# Patient Record
Sex: Female | Born: 1975 | Race: White | Hispanic: No | Smoking: Never smoker
Health system: Southern US, Community
[De-identification: ages and names within clinical notes are randomized; demographics above are authoritative.]

## PROBLEM LIST (undated history)

## (undated) DIAGNOSIS — N838 Other noninflammatory disorders of ovary, fallopian tube and broad ligament: Secondary | ICD-10-CM

## (undated) HISTORY — DX: Other noninflammatory disorders of ovary, fallopian tube and broad ligament: N83.8

## (undated) HISTORY — PX: APPENDECTOMY: SHX54

## (undated) HISTORY — PX: GASTRIC BYPASS: SHX52

## (undated) HISTORY — PX: OVARIAN CYST REMOVAL: SHX89

---

## 2006-02-03 ENCOUNTER — Emergency Department: Payer: Self-pay | Admitting: Emergency Medicine

## 2006-02-13 ENCOUNTER — Ambulatory Visit: Payer: Self-pay | Admitting: Surgery

## 2006-03-13 ENCOUNTER — Ambulatory Visit: Payer: Self-pay | Admitting: Surgery

## 2006-04-26 ENCOUNTER — Ambulatory Visit: Payer: Self-pay | Admitting: Obstetrics and Gynecology

## 2006-04-28 ENCOUNTER — Inpatient Hospital Stay: Payer: Self-pay | Admitting: Obstetrics and Gynecology

## 2006-05-18 ENCOUNTER — Inpatient Hospital Stay: Payer: Self-pay | Admitting: Obstetrics and Gynecology

## 2014-08-14 ENCOUNTER — Other Ambulatory Visit: Payer: Self-pay | Admitting: Bariatrics

## 2014-08-29 ENCOUNTER — Ambulatory Visit
Admission: RE | Admit: 2014-08-29 | Discharge: 2014-08-29 | Disposition: A | Discharge status: Home / Self Care | Payer: BLUE CROSS/BLUE SHIELD | Source: Ambulatory Visit | Attending: Bariatrics | Admitting: Bariatrics

## 2014-08-29 ENCOUNTER — Ambulatory Visit: Admission: RE | Admit: 2014-08-29 | Payer: Self-pay | Source: Ambulatory Visit

## 2014-08-29 ENCOUNTER — Other Ambulatory Visit
Admission: RE | Admit: 2014-08-29 | Discharge: 2014-08-29 | Disposition: A | Discharge status: Home / Self Care | Payer: BLUE CROSS/BLUE SHIELD | Source: Ambulatory Visit | Attending: Bariatrics | Admitting: Bariatrics

## 2014-08-29 ENCOUNTER — Other Ambulatory Visit: Payer: Self-pay | Admitting: Bariatrics

## 2014-08-29 DIAGNOSIS — Z01818 Encounter for other preprocedural examination: Secondary | ICD-10-CM | POA: Insufficient documentation

## 2014-08-29 DIAGNOSIS — Z01810 Encounter for preprocedural cardiovascular examination: Secondary | ICD-10-CM | POA: Diagnosis present

## 2014-08-29 DIAGNOSIS — Z01812 Encounter for preprocedural laboratory examination: Secondary | ICD-10-CM | POA: Diagnosis present

## 2014-08-29 DIAGNOSIS — K219 Gastro-esophageal reflux disease without esophagitis: Secondary | ICD-10-CM | POA: Insufficient documentation

## 2014-08-29 LAB — COMPREHENSIVE METABOLIC PANEL
ALT: 38 U/L (ref 14–54)
AST: 31 U/L (ref 15–41)
Albumin: 3.5 g/dL (ref 3.5–5.0)
Alkaline Phosphatase: 66 U/L (ref 38–126)
Anion gap: 9 (ref 5–15)
BUN: 18 mg/dL (ref 6–20)
CALCIUM: 9 mg/dL (ref 8.9–10.3)
CHLORIDE: 101 mmol/L (ref 101–111)
CO2: 28 mmol/L (ref 22–32)
CREATININE: 0.65 mg/dL (ref 0.44–1.00)
GLUCOSE: 116 mg/dL — AB (ref 65–99)
Potassium: 3.8 mmol/L (ref 3.5–5.1)
Sodium: 138 mmol/L (ref 135–145)
Total Bilirubin: 0.5 mg/dL (ref 0.3–1.2)
Total Protein: 7.7 g/dL (ref 6.5–8.1)

## 2014-08-29 LAB — CBC WITH DIFFERENTIAL/PLATELET
BASOS ABS: 0 10*3/uL (ref 0–0.1)
Basophils Relative: 0 %
Eosinophils Absolute: 0.2 10*3/uL (ref 0–0.7)
Eosinophils Relative: 2 %
HCT: 36 % (ref 35.0–47.0)
HEMOGLOBIN: 11.8 g/dL — AB (ref 12.0–16.0)
LYMPHS PCT: 28 %
Lymphs Abs: 2.9 10*3/uL (ref 1.0–3.6)
MCH: 25.8 pg — ABNORMAL LOW (ref 26.0–34.0)
MCHC: 32.8 g/dL (ref 32.0–36.0)
MCV: 78.8 fL — AB (ref 80.0–100.0)
Monocytes Absolute: 0.4 10*3/uL (ref 0.2–0.9)
Monocytes Relative: 4 %
NEUTROS ABS: 7 10*3/uL — AB (ref 1.4–6.5)
NEUTROS PCT: 66 %
PLATELETS: 285 10*3/uL (ref 150–440)
RBC: 4.57 MIL/uL (ref 3.80–5.20)
RDW: 13.7 % (ref 11.5–14.5)
WBC: 10.6 10*3/uL (ref 3.6–11.0)

## 2014-08-29 LAB — APTT: aPTT: 26 seconds (ref 24–36)

## 2014-08-29 LAB — PROTIME-INR
INR: 0.94
PROTHROMBIN TIME: 12.8 s (ref 11.4–15.0)

## 2014-08-29 LAB — IRON: Iron: 39 ug/dL (ref 28–170)

## 2014-08-29 LAB — PREALBUMIN: Prealbumin: 27.4 mg/dL (ref 18–38)

## 2014-08-29 LAB — FERRITIN: Ferritin: 137 ng/mL (ref 11–307)

## 2014-08-29 LAB — HEMOGLOBIN A1C: Hgb A1c MFr Bld: 5.8 % (ref 4.0–6.0)

## 2014-08-29 LAB — FOLATE: Folate: 13.9 ng/mL (ref >5.9)

## 2014-08-29 LAB — VITAMIN B12: Vitamin B-12: 332 pg/mL (ref 180–914)

## 2014-08-29 LAB — TSH: TSH: 3.221 u[IU]/mL (ref 0.350–4.500)

## 2014-08-29 LAB — MAGNESIUM: MAGNESIUM: 2.1 mg/dL (ref 1.7–2.4)

## 2014-08-30 LAB — VITAMIN D 25 HYDROXY (VIT D DEFICIENCY, FRACTURES): Vit D, 25-Hydroxy: 31.9 ng/mL (ref 30.0–100.0)

## 2014-09-01 LAB — PTH, INTACT AND CALCIUM
Calcium, Total (PTH): 9.2 mg/dL (ref 8.7–10.2)
PTH: 16 pg/mL (ref 15–65)

## 2014-09-01 LAB — H PYLORI, IGM, IGG, IGA AB
H Pylori IgG: 1.1 U/mL — ABNORMAL HIGH (ref 0.0–0.8)
H. Pylogi, Iga Abs: <9 units (ref 0.0–8.9)

## 2014-09-01 LAB — VITAMIN B1: Vitamin B1 (Thiamine): 136.1 nmol/L (ref 66.5–200.0)

## 2014-09-01 LAB — COPPER, SERUM: Copper: 182 ug/dL — ABNORMAL HIGH (ref 72–166)

## 2014-09-01 LAB — MISC LABCORP TEST (SEND OUT): LabCorp test name: 180836

## 2014-09-01 LAB — ZINC: Zinc: 82 ug/dL (ref 56–134)

## 2014-09-01 LAB — VITAMIN E: Alpha-Tocopherol: 14.5 mg/L (ref 5.3–16.8)

## 2014-09-01 LAB — VITAMIN A: Vitamin A (Retinoic Acid): 64 ug/dL (ref 20–65)

## 2014-09-05 LAB — MISC LABCORP TEST (SEND OUT): LABCORP TEST NAME: 121200

## 2014-09-11 ENCOUNTER — Ambulatory Visit: Payer: BLUE CROSS/BLUE SHIELD | Attending: Neurology

## 2014-09-11 DIAGNOSIS — G4733 Obstructive sleep apnea (adult) (pediatric): Secondary | ICD-10-CM | POA: Diagnosis present

## 2014-09-11 DIAGNOSIS — G4761 Periodic limb movement disorder: Secondary | ICD-10-CM | POA: Diagnosis not present

## 2014-11-03 DIAGNOSIS — G4733 Obstructive sleep apnea (adult) (pediatric): Secondary | ICD-10-CM | POA: Insufficient documentation

## 2015-07-09 ENCOUNTER — Other Ambulatory Visit: Payer: Self-pay | Admitting: Bariatrics

## 2015-07-09 ENCOUNTER — Ambulatory Visit
Admission: RE | Admit: 2015-07-09 | Discharge: 2015-07-09 | Disposition: A | Discharge status: Home / Self Care | Payer: BLUE CROSS/BLUE SHIELD | Source: Ambulatory Visit | Attending: Bariatrics | Admitting: Bariatrics

## 2015-07-09 DIAGNOSIS — Z9884 Bariatric surgery status: Secondary | ICD-10-CM | POA: Insufficient documentation

## 2015-07-09 DIAGNOSIS — R112 Nausea with vomiting, unspecified: Secondary | ICD-10-CM

## 2016-08-09 DIAGNOSIS — Z9884 Bariatric surgery status: Secondary | ICD-10-CM | POA: Insufficient documentation

## 2016-08-09 DIAGNOSIS — Z6833 Body mass index (BMI) 33.0-33.9, adult: Secondary | ICD-10-CM | POA: Insufficient documentation

## 2016-08-12 DIAGNOSIS — G8929 Other chronic pain: Secondary | ICD-10-CM | POA: Insufficient documentation

## 2016-08-12 DIAGNOSIS — M25551 Pain in right hip: Secondary | ICD-10-CM | POA: Insufficient documentation

## 2016-08-16 ENCOUNTER — Other Ambulatory Visit: Payer: Self-pay | Admitting: Unknown Physician Specialty

## 2016-08-16 DIAGNOSIS — M25551 Pain in right hip: Secondary | ICD-10-CM

## 2016-08-16 DIAGNOSIS — G8929 Other chronic pain: Secondary | ICD-10-CM

## 2017-08-03 IMAGING — RF DG UGI W/O KUB
7 series · 7 of 7 positions shown · non-contrast
Comparison: August 29, 2014.

CLINICAL DATA: Bariatric surgery.

EXAM:
UPPER GI SERIES WITHOUT KUB
TECHNIQUE: Routine upper GI series was performed with thin density barium.
FLUOROSCOPY TIME:  Radiation Exposure Index (as provided by the
fluoroscopic device): 43.6 mGy

[Series 1: fluoro_barium singleshot_bw · 0.17mm/px · 1 of 1 slices shown (1 of 7)]
[im 1/1]
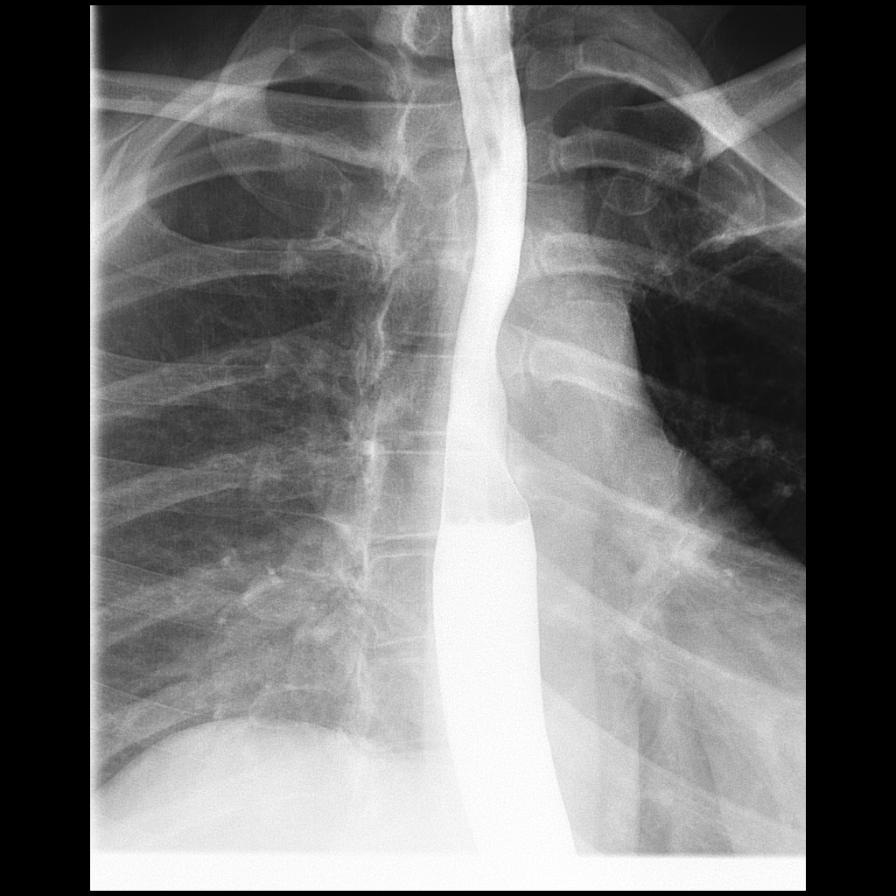

[Series 2: fluoro_barium singleshot_bw · 0.17mm/px · 1 of 1 slices shown (2 of 7)]
[im 1/1]
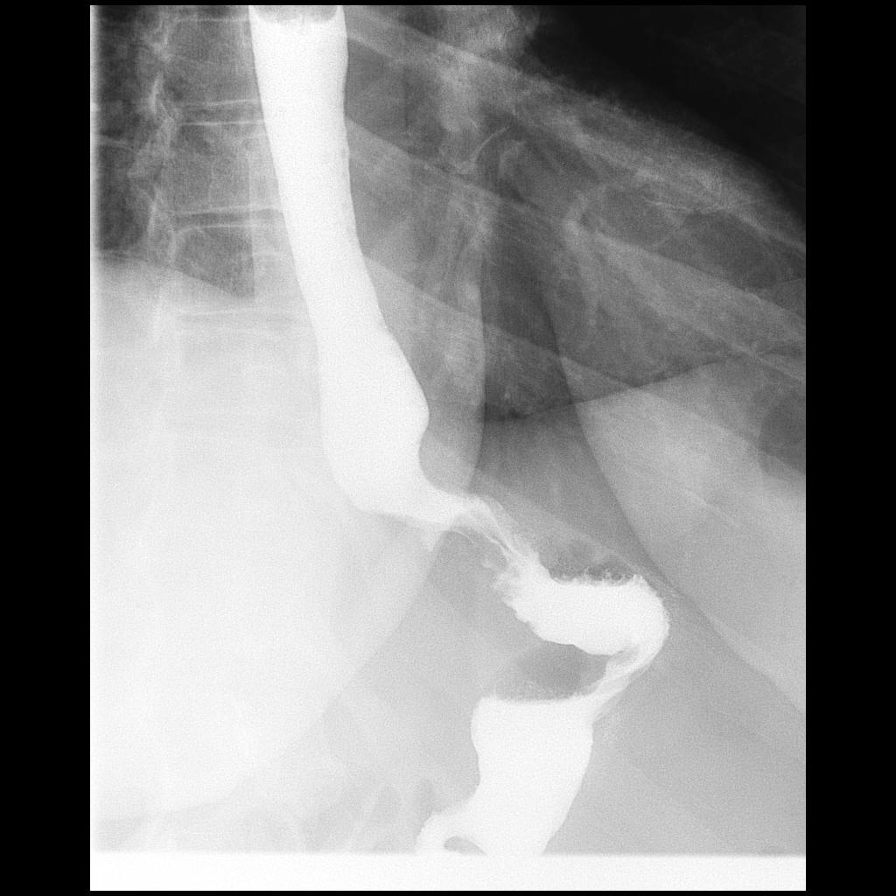

[Series 3: fluoro_barium singleshot_bw · 0.17mm/px · 1 of 1 slices shown (3 of 7)]
[im 1/1]
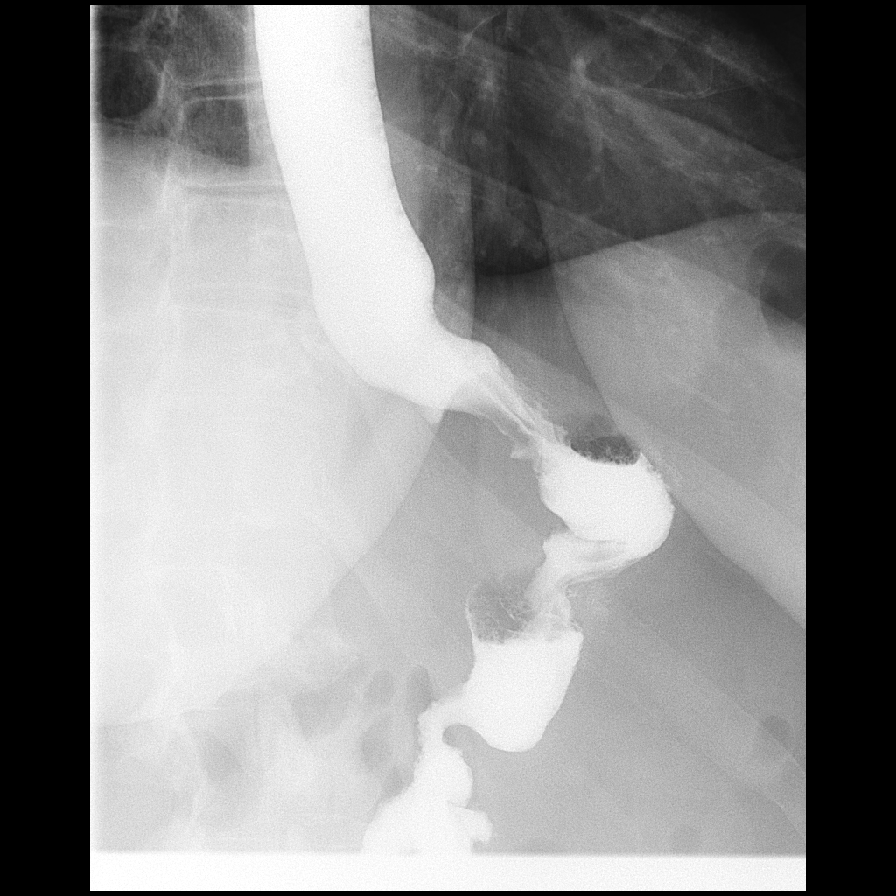

[Series 4: fluoro_barium singleshot_bw · 0.17mm/px · 1 of 1 slices shown (4 of 7)]
[im 1/1]
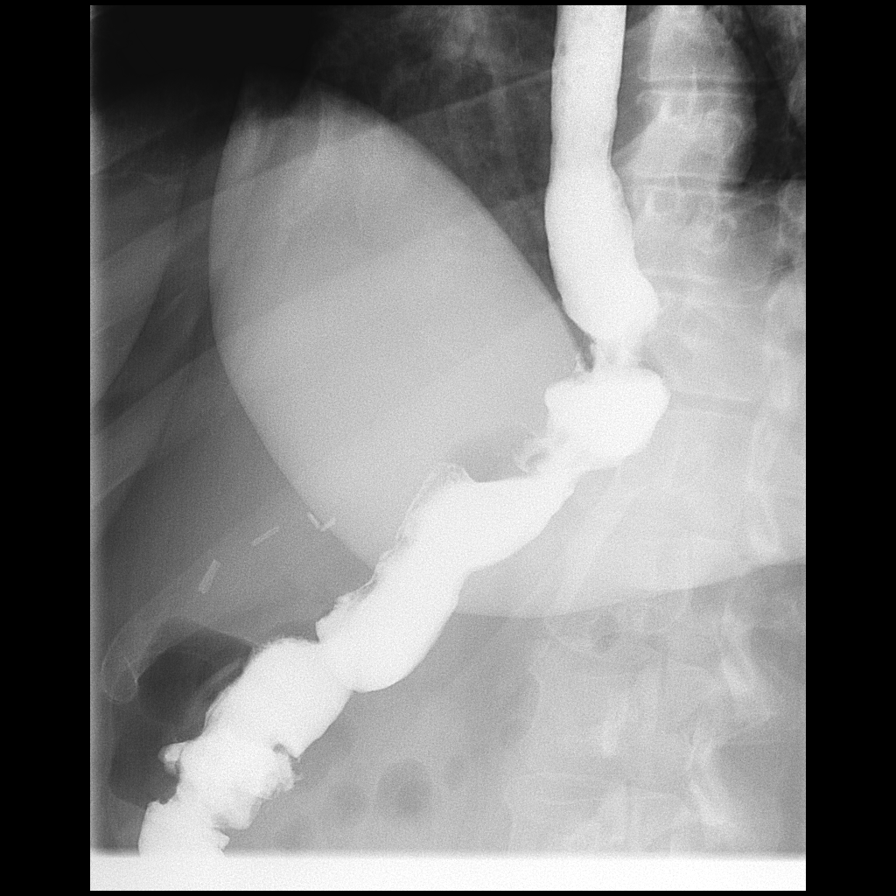

[Series 5: fluoro_barium singleshot_bw · 0.17mm/px · 1 of 1 slices shown (5 of 7)]
[im 1/1]
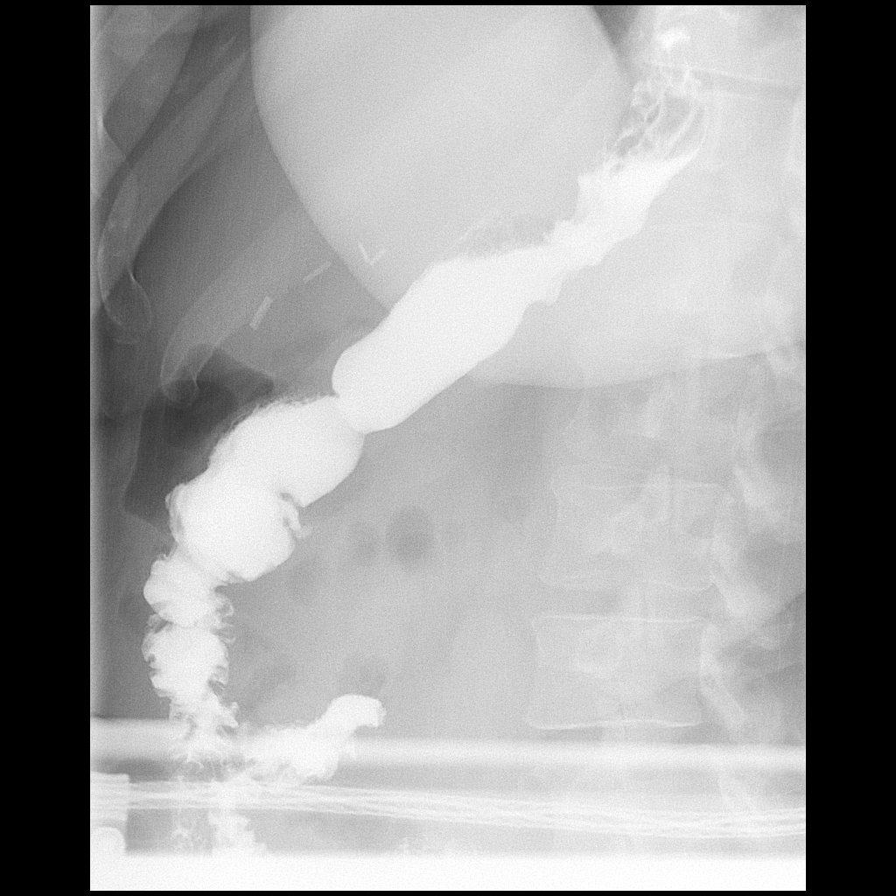

[Series 6: fluoro_barium singleshot_bw · 0.17mm/px · 1 of 1 slices shown (6 of 7)]
[im 1/1]
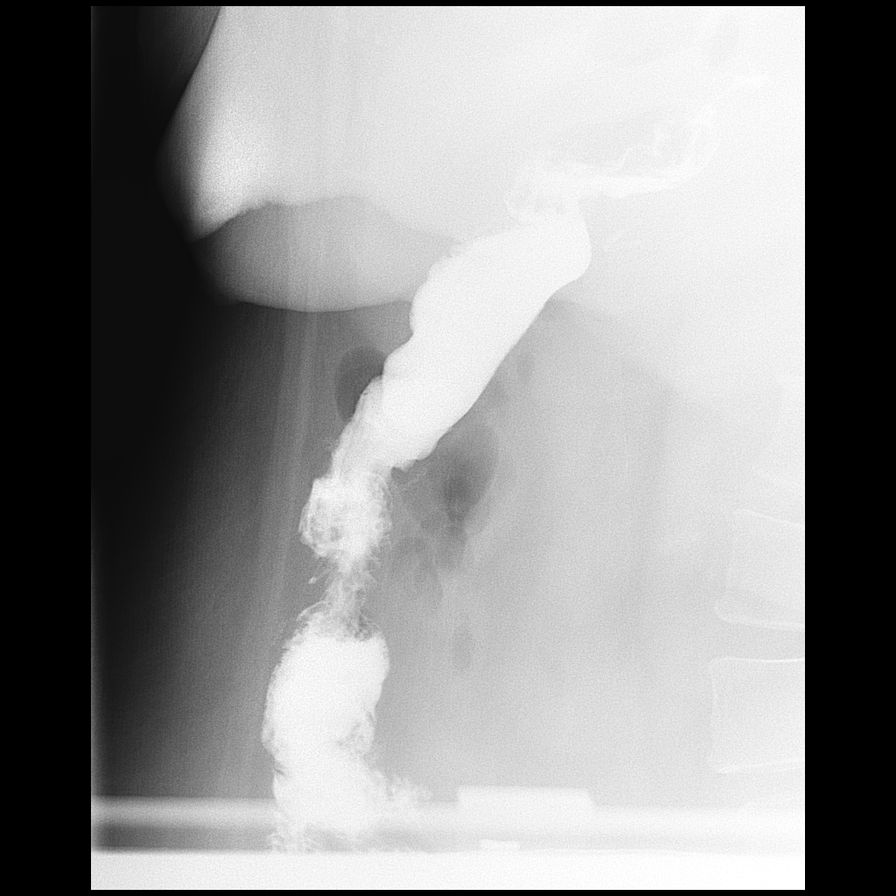

[Series 7: fluoro_barium singleshot_bw · 0.17mm/px · 1 of 1 slices shown (7 of 7)]
[im 1/1]
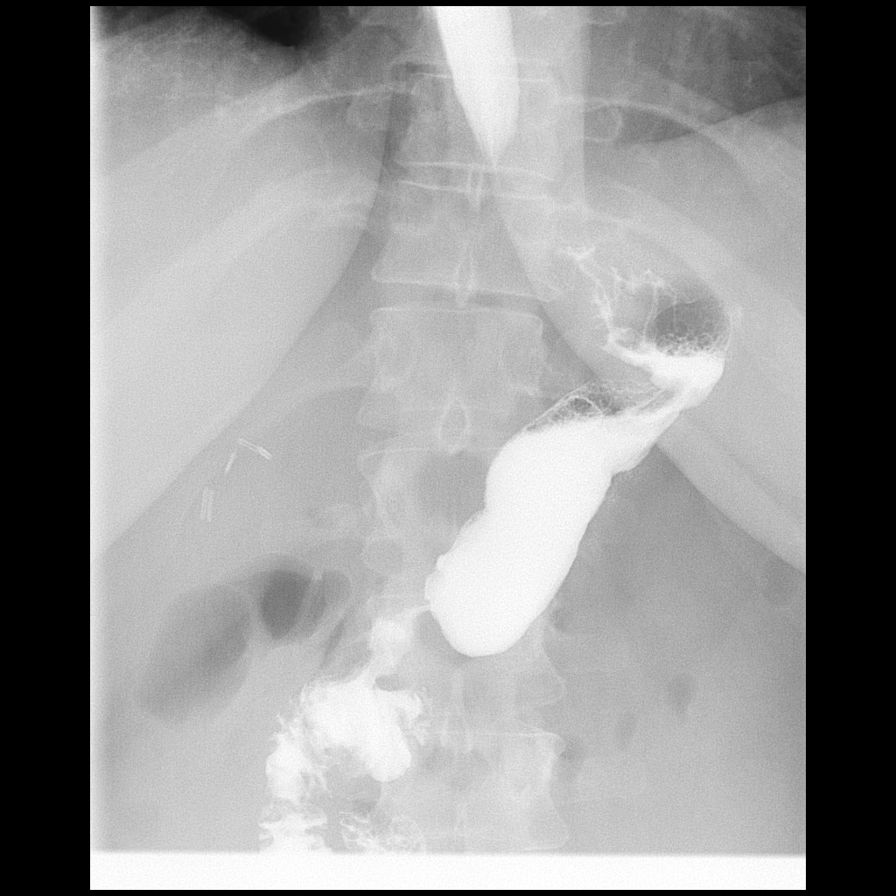

[7 of 7 positions shown; findings below may reference images not displayed]

FINDINGS: Thoracic esophagus is widely patent. Prior gastric sleeve. Gastric
remnant widely patent. Gastric outflow tract widely patent. No
evidence of obstruction. No focal mass lesion.
IMPRESSION: Normal postoperative change.

## 2018-12-07 ENCOUNTER — Other Ambulatory Visit: Payer: Self-pay

## 2018-12-07 DIAGNOSIS — Z20822 Contact with and (suspected) exposure to covid-19: Secondary | ICD-10-CM

## 2018-12-10 LAB — NOVEL CORONAVIRUS, NAA: SARS-CoV-2, NAA: DETECTED — AB

## 2020-01-23 ENCOUNTER — Other Ambulatory Visit: Payer: Self-pay | Admitting: Family Medicine

## 2020-01-23 ENCOUNTER — Other Ambulatory Visit: Payer: Self-pay

## 2020-01-23 ENCOUNTER — Ambulatory Visit
Admission: RE | Admit: 2020-01-23 | Discharge: 2020-01-23 | Disposition: A | Discharge status: Home / Self Care | Payer: BLUE CROSS/BLUE SHIELD | Source: Ambulatory Visit | Attending: Family Medicine | Admitting: Family Medicine

## 2020-01-23 DIAGNOSIS — R1031 Right lower quadrant pain: Secondary | ICD-10-CM

## 2020-06-08 ENCOUNTER — Encounter: Payer: Self-pay | Admitting: Certified Nurse Midwife

## 2020-06-08 ENCOUNTER — Other Ambulatory Visit: Payer: Self-pay

## 2020-06-08 ENCOUNTER — Ambulatory Visit: Payer: BLUE CROSS/BLUE SHIELD | Admitting: Certified Nurse Midwife

## 2020-06-08 VITALS — BP 136/84 | HR 50 | Ht 62.0 in | Wt 200.9 lb

## 2020-06-08 DIAGNOSIS — Z8742 Personal history of other diseases of the female genital tract: Secondary | ICD-10-CM | POA: Diagnosis not present

## 2020-06-08 NOTE — Progress Notes (Signed)
GYN ENCOUNTER NOTE  Subjective:       Megan Snyder is a 45 y.o. No obstetric history on file. female is here for gynecologic evaluation of the following issues:  1. Follow up for ovarian cysts, pt states she had CT scan that showed ovarian cyst. She has history of having her left ovary removed due to a large mass. She denies any pain currently. Pt states she had appendicitis that ruptured and that is when she had the CT done. States since having her appendix removed she feels the best she has felt in 3 months. Has history of PCOS.    Obstetric History OB History  No obstetric history on file.    Past Medical History:  Diagnosis Date  . Ovarian mass, left     Past Surgical History:  Procedure Laterality Date  . APPENDECTOMY    . GASTRIC BYPASS    . OVARIAN CYST REMOVAL      No current outpatient medications on file prior to visit.   No current facility-administered medications on file prior to visit.    Allergies  Allergen Reactions  . Amoxicillin Hives and Itching    Social History   Socioeconomic History  . Marital status: Married    Spouse name: Not on file  . Number of children: Not on file  . Years of education: Not on file  . Highest education level: Not on file  Occupational History  . Not on file  Tobacco Use  . Smoking status: Never Smoker  . Smokeless tobacco: Never Used  Substance and Sexual Activity  . Alcohol use: Yes    Comment: social   . Drug use: Never  . Sexual activity: Yes    Partners: Male    Birth control/protection: None  Other Topics Concern  . Not on file  Social History Narrative  . Not on file   Social Determinants of Health   Financial Resource Strain: Not on file  Food Insecurity: Not on file  Transportation Needs: Not on file  Physical Activity: Not on file  Stress: Not on file  Social Connections: Not on file  Intimate Partner Violence: Not on file    Family History  Problem Relation Age of Onset  . Arthritis  Mother   . Mental illness Father     The following portions of the patient's history were reviewed and updated as appropriate: allergies, current medications, past family history, past medical history, past social history, past surgical history and problem list.  Review of Systems Review of Systems - Negative except as mentioned in HPI Review of Systems - General ROS: negative for - chills, fatigue, fever, hot flashes, malaise or night sweats Hematological and Lymphatic ROS: negative for - bleeding problems or swollen lymph nodes Gastrointestinal ROS: negative for - abdominal pain, blood in stools, change in bowel habits and nausea/vomiting Musculoskeletal ROS: negative for - joint pain, muscle pain or muscular weakness Genito-Urinary ROS: negative for - change in menstrual cycle, dysmenorrhea, dyspareunia, dysuria, genital discharge, genital ulcers, hematuria, incontinence, irregular/heavy menses, nocturia or pelvic pain.   Objective:   BP 136/84   Pulse (!) 50   Ht 5\' 2"  (1.575 m)   Wt 200 lb 14.4 oz (91.1 kg)   BMI 36.75 kg/m  CONSTITUTIONAL: Well-developed, well-nourished female in no acute distress.  HENT:  Normocephalic, atraumatic.  NECK: Normal range of motion, supple, no masses.  Normal thyroid.  SKIN: Skin is warm and dry. No rash noted. Not diaphoretic. No erythema. No pallor.  NEUROLGIC: Alert and oriented to person, place, and time. PSYCHIATRIC: Normal mood and affect. Normal behavior. Normal judgment and thought content. CARDIOVASCULAR:Not Examined RESPIRATORY: Not Examined BREASTS: Not Examined ABDOMEN: Soft, non distended; Non tender.  No Organomegaly. PELVIC:not indicated MUSCULOSKELETAL: Normal range of motion. No tenderness.  No cyanosis, clubbing, or edema.  CT Abdomen Pelvis with IV Contrast: @ [facility] 05/08/2020 shows REPRODUCTIVE ORGANS: Redemonstrated asymmetric enlargement of the right adnexa with multiple cystic lesions, likely functional cysts. The uterus  anteverted. There is a unchanged 0.7 cm low attenuating structure along the left aspect of the lower uterine segment (series 2, image 128), too small to characterize    Assessment:   1. History of ovarian cyst - US PELVIC COMPLETE WITH TRANSVAGINAL; Future     Plan:   Discussed ovarian cyst in normal menstrual cycle. Given that the pt is not in any pain it is likely a functional cyst. Order place for pelvic u/s to re evaluate given that she has recovered from the appendicitis. Will follow up with results. Pt encouraged to make appointment for annual exam. She verbalizes and agrees to plan. Pamphlet give to pt on ovarian cyst.   Face to face time 15 min.   Doreene Burke, CNM

## 2020-08-31 ENCOUNTER — Ambulatory Visit: Payer: BLUE CROSS/BLUE SHIELD

## 2020-09-02 ENCOUNTER — Other Ambulatory Visit: Payer: Self-pay | Admitting: Certified Nurse Midwife

## 2020-09-02 ENCOUNTER — Ambulatory Visit (INDEPENDENT_AMBULATORY_CARE_PROVIDER_SITE_OTHER): Payer: Self-pay | Admitting: Certified Nurse Midwife

## 2020-09-02 ENCOUNTER — Encounter: Payer: Self-pay | Admitting: Certified Nurse Midwife

## 2020-09-02 ENCOUNTER — Other Ambulatory Visit: Payer: Self-pay

## 2020-09-02 ENCOUNTER — Other Ambulatory Visit (HOSPITAL_COMMUNITY)
Admission: RE | Admit: 2020-09-02 | Discharge: 2020-09-02 | Disposition: A | Discharge status: Home / Self Care | Payer: BLUE CROSS/BLUE SHIELD | Source: Ambulatory Visit | Attending: Certified Nurse Midwife | Admitting: Certified Nurse Midwife

## 2020-09-02 VITALS — BP 144/84 | HR 47 | Resp 16 | Ht 63.0 in | Wt 208.5 lb

## 2020-09-02 DIAGNOSIS — Z124 Encounter for screening for malignant neoplasm of cervix: Secondary | ICD-10-CM

## 2020-09-02 DIAGNOSIS — Z114 Encounter for screening for human immunodeficiency virus [HIV]: Secondary | ICD-10-CM

## 2020-09-02 DIAGNOSIS — Z13220 Encounter for screening for lipoid disorders: Secondary | ICD-10-CM

## 2020-09-02 DIAGNOSIS — Z1231 Encounter for screening mammogram for malignant neoplasm of breast: Secondary | ICD-10-CM

## 2020-09-02 DIAGNOSIS — Z1159 Encounter for screening for other viral diseases: Secondary | ICD-10-CM

## 2020-09-02 DIAGNOSIS — Z01419 Encounter for gynecological examination (general) (routine) without abnormal findings: Secondary | ICD-10-CM

## 2020-09-02 DIAGNOSIS — Z131 Encounter for screening for diabetes mellitus: Secondary | ICD-10-CM

## 2020-09-02 NOTE — Patient Instructions (Signed)
Preventive Care 44-44 Years Old, Female Preventive care refers to lifestyle choices and visits with your health care provider that can promote health and wellness. This includes: A yearly physical exam. This is also called an annual wellness visit. Regular dental and eye exams. Immunizations. Screening for certain conditions. Healthy lifestyle choices, such as: Eating a healthy diet. Getting regular exercise. Not using drugs or products that contain nicotine and tobacco. Limiting alcohol use. What can I expect for my preventive care visit? Physical exam Your health care provider will check your: Height and weight. These may be used to calculate your BMI (body mass index). BMI is a measurement that tells if you are at a healthy weight. Heart rate and blood pressure. Body temperature. Skin for abnormal spots. Counseling Your health care provider may ask you questions about your: Past medical problems. Family's medical history. Alcohol, tobacco, and drug use. Emotional well-being. Home life and relationship well-being. Sexual activity. Diet, exercise, and sleep habits. Work and work Statistician. Access to firearms. Method of birth control. Menstrual cycle. Pregnancy history. What immunizations do I need?  Vaccines are usually given at various ages, according to a schedule. Your health care provider will recommend vaccines for you based on your age, medicalhistory, and lifestyle or other factors, such as travel or where you work. What tests do I need? Blood tests Lipid and cholesterol levels. These may be checked every 5 years, or more often if you are over 44 years old. Hepatitis C test. Hepatitis B test. Screening Lung cancer screening. You may have this screening every year starting at age 44 if you have a 30-pack-year history of smoking and currently smoke or have quit within the past 15 years. Colorectal cancer screening. All adults should have this screening starting at  age 44 and continuing until age 44. Your health care provider may recommend screening at age 44 if you are at increased risk. You will have tests every 1-10 years, depending on your results and the type of screening test. Diabetes screening. This is done by checking your blood sugar (glucose) after you have not eaten for a while (fasting). You may have this done every 1-3 years. Mammogram. This may be done every 1-2 years. Talk with your health care provider about when you should start having regular mammograms. This may depend on whether you have a family history of breast cancer. BRCA-related cancer screening. This may be done if you have a family history of breast, ovarian, tubal, or peritoneal cancers. Pelvic exam and Pap test. This may be done every 3 years starting at age 44. Starting at age 54, this may be done every 5 years if you have a Pap test in combination with an HPV test. Other tests STD (sexually transmitted disease) testing, if you are at risk. Bone density scan. This is done to screen for osteoporosis. You may have this scan if you are at high risk for osteoporosis. Talk with your health care provider about your test results, treatment options,and if necessary, the need for more tests. Follow these instructions at home: Eating and drinking  Eat a diet that includes fresh fruits and vegetables, whole grains, lean protein, and low-fat dairy products. Take vitamin and mineral supplements as recommended by your health care provider. Do not drink alcohol if: Your health care provider tells you not to drink. You are pregnant, may be pregnant, or are planning to become pregnant. If you drink alcohol: Limit how much you have to 0-1 drink a day. Be aware  of how much alcohol is in your drink. In the [place] one drink equals one 12 oz bottle of beer (355 mL), one 5 oz glass of wine (148 mL), or one 1 oz glass of hard liquor (44 mL).  Lifestyle Take daily care of your teeth and  gums. Brush your teeth every morning and night with fluoride toothpaste. Floss one time each day. Stay active. Exercise for at least 30 minutes 5 or more days each week. Do not use any products that contain nicotine or tobacco, such as cigarettes, e-cigarettes, and chewing tobacco. If you need help quitting, ask your health care provider. Do not use drugs. If you are sexually active, practice safe sex. Use a condom or other form of protection to prevent STIs (sexually transmitted infections). If you do not wish to become pregnant, use a form of birth control. If you plan to become pregnant, see your health care provider for a prepregnancy visit. If told by your health care provider, take low-dose aspirin daily starting at age 29. Find healthy ways to cope with stress, such as: Meditation, yoga, or listening to music. Journaling. Talking to a trusted person. Spending time with friends and family. Safety Always wear your seat belt while driving or riding in a vehicle. Do not drive: If you have been drinking alcohol. Do not ride with someone who has been drinking. When you are tired or distracted. While texting. Wear a helmet and other protective equipment during sports activities. If you have firearms in your house, make sure you follow all gun safety procedures. What's next? Visit your health care provider once a year for an annual wellness visit. Ask your health care provider how often you should have your eyes and teeth checked. Stay up to date on all vaccines. This information is not intended to replace advice given to you by your health care provider. Make sure you discuss any questions you have with your healthcare provider. Document Revised: 10/08/2019 Document Reviewed: 09/14/2017 Elsevier Patient Education  2022 [employer].

## 2020-09-02 NOTE — Progress Notes (Signed)
GYNECOLOGY ANNUAL PREVENTATIVE CARE ENCOUNTER NOTE  History:     Megan Snyder is a 45 y.o. No obstetric history on file. female here for a routine annual gynecologic exam.  Current complaints: none.   Denies abnormal vaginal bleeding, discharge, pelvic pain, problems with intercourse or other gynecologic concerns.     Social Relationship: female partner Living: with daughter ( adopted with handicap) and patner  Work: pre Engineer, site Exercise: 2 time week Smoke/Alcohol/drug use: alcohol 2 x month, denies smoking and drug use.   Gynecologic History Patient's last menstrual period was 08/17/2020. Contraception: none Last Pap: 2019 she thinks . Results were: normal with negative HPV (per pt) Last mammogram: has not had one   Upstream - 09/02/20 1344       Pregnancy Intention Screening   Does the patient want to become pregnant in the next year? No    Does the patient's partner want to become pregnant in the next year? No    Would the patient like to discuss contraceptive options today? No      Contraception Wrap Up   Current Method No Method - Other Reason    End Method No Method - Other Reason    Contraception Counseling Provided No            The pregnancy intention screening data noted above was reviewed. Potential methods of contraception were discussed. The patient elected to proceed with No Method - Other Reason.   Obstetric History OB History  No obstetric history on file.    Past Medical History:  Diagnosis Date   Ovarian mass, left     Past Surgical History:  Procedure Laterality Date   APPENDECTOMY     GASTRIC BYPASS     OVARIAN CYST REMOVAL      No current outpatient medications on file prior to visit.   No current facility-administered medications on file prior to visit.    Allergies  Allergen Reactions   Amoxicillin Hives and Itching    Social History:  reports that she has never smoked. She has never used smokeless tobacco. She  reports current alcohol use. She reports that she does not use drugs.  Family History  Problem Relation Age of Onset   Arthritis Mother    Mental illness Father     The following portions of the patient's history were reviewed and updated as appropriate: allergies, current medications, past family history, past medical history, past social history, past surgical history and problem list.  Review of Systems Pertinent items noted in HPI and remainder of comprehensive ROS otherwise negative.  Physical Exam:  BP (!) 144/84   Pulse (!) 47   Resp 16   Ht 5\' 3"  (1.6 m)   Wt 208 lb 8 oz (94.6 kg)   LMP 08/17/2020   BMI 36.93 kg/m  CONSTITUTIONAL: Well-developed, well-nourished female in no acute distress.  HENT:  Normocephalic, atraumatic, External right and left ear normal. Oropharynx is clear and moist EYES: Conjunctivae and EOM are normal. Pupils are equal, round, and reactive to light. No scleral icterus.  NECK: Normal range of motion, supple, no masses.  Normal thyroid.  SKIN: Skin is warm and dry. No rash noted. Not diaphoretic. No erythema. No pallor. MUSCULOSKELETAL: Normal range of motion. No tenderness.  No cyanosis, clubbing, or edema.  2+ distal pulses. NEUROLOGIC: Alert and oriented to person, place, and time. Normal reflexes, muscle tone coordination.  PSYCHIATRIC: Normal mood and affect. Normal behavior. Normal judgment and thought content.  CARDIOVASCULAR: Normal heart rate noted, regular rhythm RESPIRATORY: Clear to auscultation bilaterally. Effort and breath sounds normal, no problems with respiration noted. BREASTS: Symmetric in size. No masses, tenderness, skin changes, nipple drainage, or lymphadenopathy bilaterally.  ABDOMEN: Soft, no distention noted.  No tenderness, rebound or guarding.  PELVIC: Normal appearing external genitalia and urethral meatus; normal appearing vaginal mucosa and cervix.  No abnormal discharge noted.  Pap smear obtained.  Normal uterine size,  no other palpable masses, no uterine or adnexal tenderness.  .   Assessment and Plan:    1. Women's annual routine gynecological examination   Pap:Will follow up results of pap smear and manage accordingly. Mammogram : ordered Labs: Cbc, cmb, tsh, lipid, hem a 1c, hep c  Refills: none Referral: none  Routine preventative health maintenance measures emphasized. Please refer to After Visit Summary for other counseling recommendations.      Doreene Burke, CNM [facility],  [facility]

## 2020-09-03 LAB — COMPREHENSIVE METABOLIC PANEL
ALT: 12 IU/L (ref 0–32)
AST: 19 IU/L (ref 0–40)
Albumin/Globulin Ratio: 1.7 (ref 1.2–2.2)
Albumin: 4.5 g/dL (ref 3.8–4.8)
Alkaline Phosphatase: 62 IU/L (ref 44–121)
BUN/Creatinine Ratio: 33 — ABNORMAL HIGH (ref 9–23)
BUN: 22 mg/dL (ref 6–24)
Bilirubin Total: 0.2 mg/dL (ref 0.0–1.2)
CO2: 25 mmol/L (ref 20–29)
Calcium: 9.5 mg/dL (ref 8.7–10.2)
Chloride: 101 mmol/L (ref 96–106)
Creatinine, Ser: 0.66 mg/dL (ref 0.57–1.00)
Globulin, Total: 2.7 g/dL (ref 1.5–4.5)
Glucose: 82 mg/dL (ref 65–99)
Potassium: 4.5 mmol/L (ref 3.5–5.2)
Sodium: 140 mmol/L (ref 134–144)
Total Protein: 7.2 g/dL (ref 6.0–8.5)
eGFR: 111 mL/min/{1.73_m2} (ref >59)

## 2020-09-03 LAB — LIPID PANEL
Chol/HDL Ratio: 2.7 ratio (ref 0.0–4.4)
Cholesterol, Total: 205 mg/dL — ABNORMAL HIGH (ref 100–199)
HDL: 77 mg/dL (ref >39)
LDL Chol Calc (NIH): 107 mg/dL — ABNORMAL HIGH (ref 0–99)
Triglycerides: 122 mg/dL (ref 0–149)
VLDL Cholesterol Cal: 21 mg/dL (ref 5–40)

## 2020-09-03 LAB — CBC WITH DIFFERENTIAL/PLATELET
Basophils Absolute: 0.1 10*3/uL (ref 0.0–0.2)
Basos: 1 %
EOS (ABSOLUTE): 0.1 10*3/uL (ref 0.0–0.4)
Eos: 1 %
Hematocrit: 41 % (ref 34.0–46.6)
Hemoglobin: 13.6 g/dL (ref 11.1–15.9)
Immature Grans (Abs): 0 10*3/uL (ref 0.0–0.1)
Immature Granulocytes: 0 %
Lymphocytes Absolute: 2.7 10*3/uL (ref 0.7–3.1)
Lymphs: 36 %
MCH: 27.8 pg (ref 26.6–33.0)
MCHC: 33.2 g/dL (ref 31.5–35.7)
MCV: 84 fL (ref 79–97)
Monocytes Absolute: 0.4 10*3/uL (ref 0.1–0.9)
Monocytes: 6 %
Neutrophils Absolute: 4.2 10*3/uL (ref 1.4–7.0)
Neutrophils: 56 %
Platelets: 342 10*3/uL (ref 150–450)
RBC: 4.9 x10E6/uL (ref 3.77–5.28)
RDW: 12.5 % (ref 11.7–15.4)
WBC: 7.5 10*3/uL (ref 3.4–10.8)

## 2020-09-03 LAB — THYROID PANEL WITH TSH
Free Thyroxine Index: 2.3 (ref 1.2–4.9)
T3 Uptake Ratio: 28 % (ref 24–39)
T4, Total: 8.3 ug/dL (ref 4.5–12.0)
TSH: 2.03 u[IU]/mL (ref 0.450–4.500)

## 2020-09-03 LAB — HEMOGLOBIN A1C
Est. average glucose Bld gHb Est-mCnc: 105 mg/dL
Hgb A1c MFr Bld: 5.3 % (ref 4.8–5.6)

## 2020-09-03 LAB — HIV ANTIBODY (ROUTINE TESTING W REFLEX): HIV Screen 4th Generation wRfx: NONREACTIVE

## 2020-09-03 LAB — HEPATITIS C ANTIBODY: Hep C Virus Ab: <0.1 s/co ratio (ref 0.0–0.9)

## 2020-09-04 NOTE — Progress Notes (Signed)
Informed patient of lab results. Patient verbalized understanding and expressed no questions or concerns.

## 2020-09-07 LAB — CYTOLOGY - PAP
Comment: NEGATIVE
Diagnosis: NEGATIVE
High risk HPV: NEGATIVE

## 2020-10-12 ENCOUNTER — Other Ambulatory Visit: Payer: Self-pay

## 2020-10-12 ENCOUNTER — Telehealth: Payer: Self-pay | Admitting: Certified Nurse Midwife

## 2020-10-12 DIAGNOSIS — N898 Other specified noninflammatory disorders of vagina: Secondary | ICD-10-CM

## 2020-10-12 MED ORDER — METRONIDAZOLE 500 MG PO TABS
500.0000 mg | ORAL_TABLET | Freq: Two times a day (BID) | ORAL | 0 refills | Status: DC
Start: 2020-10-12 — End: 2022-08-19

## 2020-10-12 NOTE — Progress Notes (Signed)
Rx sent for patient.

## 2020-10-12 NOTE — Telephone Encounter (Signed)
Megan Snyder called in and states her and her boyfriend "got frisky" and used "some toys" and stated they weren't very clean and now she has an odor and states "things aren't right down there"  Megan Snyder would like to know if something can be called in to [facility] on [address].  Megan Snyder states she knows it is a bacterial infection and has no other symptoms except odor and she has been deal with this for 3 weeks.  Please advise.

## 2020-10-12 NOTE — Telephone Encounter (Signed)
LVM with patient informing medication has been sent to her pharmacy.

## 2021-05-21 DIAGNOSIS — M5440 Lumbago with sciatica, unspecified side: Secondary | ICD-10-CM | POA: Diagnosis not present

## 2021-05-21 DIAGNOSIS — M5431 Sciatica, right side: Secondary | ICD-10-CM | POA: Diagnosis not present

## 2021-05-21 DIAGNOSIS — M545 Low back pain, unspecified: Secondary | ICD-10-CM | POA: Diagnosis not present

## 2021-05-21 DIAGNOSIS — M5136 Other intervertebral disc degeneration, lumbar region: Secondary | ICD-10-CM | POA: Diagnosis not present

## 2021-05-31 DIAGNOSIS — M5136 Other intervertebral disc degeneration, lumbar region: Secondary | ICD-10-CM | POA: Diagnosis not present

## 2021-05-31 DIAGNOSIS — M5416 Radiculopathy, lumbar region: Secondary | ICD-10-CM | POA: Diagnosis not present

## 2021-07-05 DIAGNOSIS — M545 Low back pain, unspecified: Secondary | ICD-10-CM | POA: Diagnosis not present

## 2021-07-05 DIAGNOSIS — M5431 Sciatica, right side: Secondary | ICD-10-CM | POA: Diagnosis not present

## 2021-07-05 DIAGNOSIS — M6281 Muscle weakness (generalized): Secondary | ICD-10-CM | POA: Diagnosis not present

## 2021-07-13 DIAGNOSIS — M6281 Muscle weakness (generalized): Secondary | ICD-10-CM | POA: Diagnosis not present

## 2021-07-13 DIAGNOSIS — M545 Low back pain, unspecified: Secondary | ICD-10-CM | POA: Diagnosis not present

## 2021-07-13 DIAGNOSIS — M5431 Sciatica, right side: Secondary | ICD-10-CM | POA: Diagnosis not present

## 2021-08-02 DIAGNOSIS — M5431 Sciatica, right side: Secondary | ICD-10-CM | POA: Diagnosis not present

## 2021-08-02 DIAGNOSIS — M6281 Muscle weakness (generalized): Secondary | ICD-10-CM | POA: Diagnosis not present

## 2021-08-02 DIAGNOSIS — M545 Low back pain, unspecified: Secondary | ICD-10-CM | POA: Diagnosis not present

## 2021-08-09 DIAGNOSIS — M545 Low back pain, unspecified: Secondary | ICD-10-CM | POA: Diagnosis not present

## 2021-08-09 DIAGNOSIS — M6281 Muscle weakness (generalized): Secondary | ICD-10-CM | POA: Diagnosis not present

## 2021-08-09 DIAGNOSIS — M5431 Sciatica, right side: Secondary | ICD-10-CM | POA: Diagnosis not present

## 2021-08-30 DIAGNOSIS — M5136 Other intervertebral disc degeneration, lumbar region: Secondary | ICD-10-CM | POA: Diagnosis not present

## 2021-08-30 DIAGNOSIS — M5416 Radiculopathy, lumbar region: Secondary | ICD-10-CM | POA: Diagnosis not present

## 2021-09-06 ENCOUNTER — Other Ambulatory Visit: Payer: Self-pay | Admitting: Family Medicine

## 2021-09-06 DIAGNOSIS — M5416 Radiculopathy, lumbar region: Secondary | ICD-10-CM

## 2021-09-07 ENCOUNTER — Encounter: Payer: Self-pay | Admitting: Certified Nurse Midwife

## 2021-10-04 ENCOUNTER — Ambulatory Visit
Admission: RE | Admit: 2021-10-04 | Discharge: 2021-10-04 | Disposition: A | Discharge status: Home / Self Care | Payer: 59 | Source: Ambulatory Visit | Attending: Family Medicine | Admitting: Family Medicine

## 2021-10-04 DIAGNOSIS — M545 Low back pain, unspecified: Secondary | ICD-10-CM | POA: Diagnosis not present

## 2021-10-04 DIAGNOSIS — M48061 Spinal stenosis, lumbar region without neurogenic claudication: Secondary | ICD-10-CM | POA: Diagnosis not present

## 2021-10-04 DIAGNOSIS — M5416 Radiculopathy, lumbar region: Secondary | ICD-10-CM

## 2021-10-04 DIAGNOSIS — M4316 Spondylolisthesis, lumbar region: Secondary | ICD-10-CM | POA: Diagnosis not present

## 2022-08-19 ENCOUNTER — Emergency Department
Admission: EM | Admit: 2022-08-19 | Discharge: 2022-08-19 | Disposition: A | Discharge status: Home / Self Care | Payer: 59 | Attending: Student in an Organized Health Care Education/Training Program | Admitting: Student in an Organized Health Care Education/Training Program

## 2022-08-19 ENCOUNTER — Encounter: Payer: Self-pay | Admitting: Emergency Medicine

## 2022-08-19 ENCOUNTER — Other Ambulatory Visit: Payer: Self-pay

## 2022-08-19 ENCOUNTER — Emergency Department: Payer: 59

## 2022-08-19 DIAGNOSIS — I10 Essential (primary) hypertension: Secondary | ICD-10-CM | POA: Diagnosis not present

## 2022-08-19 DIAGNOSIS — G932 Benign intracranial hypertension: Secondary | ICD-10-CM | POA: Insufficient documentation

## 2022-08-19 DIAGNOSIS — R519 Headache, unspecified: Secondary | ICD-10-CM | POA: Diagnosis not present

## 2022-08-19 MED ORDER — ACETAZOLAMIDE ER 500 MG PO CP12: 500.0000 mg | ORAL_CAPSULE | Freq: Two times a day (BID) | ORAL | 0 refills | Status: AC

## 2022-08-19 NOTE — Discharge Instructions (Signed)
Please take the medication as prescribed.  Follow up with neurology.  Return to the ER for symptoms of concern if unable to see primary care or the specialist.

## 2022-08-19 NOTE — ED Provider Notes (Signed)
[facility] Provider Note    Event Date/Time   First MD Initiated Contact with Patient 08/19/22 1020     (approximate)   History   Headache   HPI  Megan Snyder is a 47 y.o. female  with no significant past medical history and as listed in EMR presents to the emergency department for evaluation of headache. She is under a significant amount of stress between home and work and has noted that she has been hypertensive.  She took one of her mom's losartan tablets last night and her blood pressure was better today but she still is complaining of the headache.  She is also had some blurred vision but since her pressure is lower the vision has improved.      Physical Exam   Triage Vital Signs: ED Triage Vitals  Encounter Vitals Group     BP 08/19/22 0922 (!) 160/91     Systolic BP Percentile --      Diastolic BP Percentile --      Pulse Rate 08/19/22 0922 60     Resp 08/19/22 0922 17     Temp 08/19/22 0922 98.3 F (36.8 C)     Temp Source 08/19/22 0922 Oral     SpO2 08/19/22 0922 98 %     Weight 08/19/22 0920 208 lb 8.9 oz (94.6 kg)     Height 08/19/22 0920 5\' 3"  (1.6 m)     Head Circumference --      Peak Flow --      Pain Score 08/19/22 0920 5     Pain Loc --      Pain Education --      Exclude from Growth Chart --     Most recent vital signs: Vitals:   08/19/22 0922 08/19/22 1223  BP: (!) 160/91 (!) 148/90  Pulse: 60 62  Resp: 17 18  Temp: 98.3 F (36.8 C) 98.1 F (36.7 C)  SpO2: 98% 98%    General: Awake, no distress.  CV:  Good peripheral perfusion.  Resp:  Normal effort.  Abd:  No distention.  Other:     ED Results / Procedures / Treatments   Labs (all labs ordered are listed, but only abnormal results are displayed) Labs Reviewed - No data to display   EKG  Not indicated   RADIOLOGY  Image and radiology report reviewed and interpreted by me. Radiology report consistent with the same.  CTs shows no evidence of  intracranial hemorrhage, infarct or cranial mass however she does have a suspected partially empty sella turcica potentially reflecting idiopathic intracranial hypertension  PROCEDURES:  Critical Care performed: No  Procedures   MEDICATIONS ORDERED IN ED:  Medications - No data to display   IMPRESSION / MDM / ASSESSMENT AND PLAN / ED COURSE   I have reviewed the triage note.  Differential diagnosis includes, but is not limited to, hypertensive urgency, migraine, tension headache, intracranial hemorrhage  Patient's presentation is most consistent with acute illness / injury with system symptoms.  47 year old female presenting to the emergency department for treatment and evaluation of headache, blurred vision, and elevated blood pressure.  See HPI for further details.  CT scan shows suspected, partially empty sella turcica potentially associated with pseudotumor cerebri.  Her constellations of symptoms is concerning and she will be started on Diamox and referred to neurology.  Case was discussed with ED attending, Dr. Vicente Males, who is agreeable to the plan.      FINAL CLINICAL  IMPRESSION(S) / ED DIAGNOSES   Final diagnoses:  Idiopathic intracranial hypertension     Rx / DC Orders   ED Discharge Orders          Ordered    acetaZOLAMIDE ER (DIAMOX) 500 MG capsule  2 times daily        08/19/22 1215             Note:  This document was prepared using Dragon voice recognition software and may include unintentional dictation errors.   Chinita Pester, FNP 08/19/22 2956    Merwyn Katos, MD 08/21/22 [phone]

## 2022-08-19 NOTE — ED Triage Notes (Signed)
Arrives from [facility] for ED evaluation.  States has been under a lot of stress.  Has had a several week history of headache and some blurred vision.  Checked Bp last night and BP was 210/100.  Took a losaarten 12.5 mg tablet last night (mothers RX) and BP better today.  Still c/o headache to top of head.  Vision remains blurry, but improved from last night.  AAOx3.  Skin warm and dry. NAD

## 2022-08-19 NOTE — ED Notes (Signed)
See triage note  Presents with headache and concerns of her blood pressure  States this has been going on for several weeks States her vision was also blurred   States it is a little better   Min relief with OTC meds  Also has been under a lot of stress

## 2022-08-24 DIAGNOSIS — R519 Headache, unspecified: Secondary | ICD-10-CM | POA: Diagnosis not present

## 2022-08-24 DIAGNOSIS — H538 Other visual disturbances: Secondary | ICD-10-CM | POA: Diagnosis not present

## 2022-08-24 DIAGNOSIS — F411 Generalized anxiety disorder: Secondary | ICD-10-CM | POA: Diagnosis not present

## 2022-08-24 DIAGNOSIS — G932 Benign intracranial hypertension: Secondary | ICD-10-CM | POA: Diagnosis not present

## 2022-09-09 ENCOUNTER — Other Ambulatory Visit: Payer: Self-pay | Admitting: Physician Assistant

## 2022-09-09 DIAGNOSIS — G932 Benign intracranial hypertension: Secondary | ICD-10-CM

## 2022-09-21 NOTE — Progress Notes (Signed)
Patient for DG Lumbar Puncture on Thurs 09/22/22, I called and spoke with the patient on the phone and gave pre-procedure instructions. Pt was made aware to be here at 9:30a. Pt stated understanding.  Called 09/21/22

## 2022-09-22 ENCOUNTER — Ambulatory Visit
Admission: RE | Admit: 2022-09-22 | Discharge: 2022-09-22 | Disposition: A | Discharge status: Home / Self Care | Payer: 59 | Source: Ambulatory Visit | Attending: Physician Assistant | Admitting: Physician Assistant

## 2022-09-22 DIAGNOSIS — R519 Headache, unspecified: Secondary | ICD-10-CM | POA: Diagnosis not present

## 2022-09-22 DIAGNOSIS — G932 Benign intracranial hypertension: Secondary | ICD-10-CM | POA: Insufficient documentation

## 2022-09-22 MED ORDER — LIDOCAINE 1 % OPTIME INJ - NO CHARGE
5.0000 mL | Freq: Once | INTRAMUSCULAR | Status: AC
  Administered 2022-09-22: 3 mL
  Filled 2022-09-22: qty 6

## 2022-10-03 DIAGNOSIS — F411 Generalized anxiety disorder: Secondary | ICD-10-CM | POA: Diagnosis not present

## 2022-10-03 DIAGNOSIS — G932 Benign intracranial hypertension: Secondary | ICD-10-CM | POA: Diagnosis not present

## 2023-01-01 DIAGNOSIS — R402142 Coma scale, eyes open, spontaneous, at arrival to emergency department: Secondary | ICD-10-CM | POA: Diagnosis not present

## 2023-01-01 DIAGNOSIS — W1830XA Fall on same level, unspecified, initial encounter: Secondary | ICD-10-CM | POA: Diagnosis not present

## 2023-01-01 DIAGNOSIS — R402252 Coma scale, best verbal response, oriented, at arrival to emergency department: Secondary | ICD-10-CM | POA: Diagnosis not present

## 2023-01-01 DIAGNOSIS — S0003XA Contusion of scalp, initial encounter: Secondary | ICD-10-CM | POA: Diagnosis not present

## 2023-01-01 DIAGNOSIS — R519 Headache, unspecified: Secondary | ICD-10-CM | POA: Diagnosis not present

## 2023-01-01 DIAGNOSIS — S0990XA Unspecified injury of head, initial encounter: Secondary | ICD-10-CM | POA: Diagnosis not present

## 2023-01-01 DIAGNOSIS — S069X1A Unspecified intracranial injury with loss of consciousness of 30 minutes or less, initial encounter: Secondary | ICD-10-CM | POA: Diagnosis not present

## 2023-01-01 DIAGNOSIS — R402362 Coma scale, best motor response, obeys commands, at arrival to emergency department: Secondary | ICD-10-CM | POA: Diagnosis not present

## 2023-02-23 DIAGNOSIS — I158 Other secondary hypertension: Secondary | ICD-10-CM | POA: Diagnosis not present

## 2023-02-23 DIAGNOSIS — R509 Fever, unspecified: Secondary | ICD-10-CM | POA: Diagnosis not present

## 2023-02-23 DIAGNOSIS — M791 Myalgia, unspecified site: Secondary | ICD-10-CM | POA: Diagnosis not present

## 2023-02-23 DIAGNOSIS — Z6841 Body Mass Index (BMI) 40.0 and over, adult: Secondary | ICD-10-CM | POA: Diagnosis not present

## 2023-02-23 DIAGNOSIS — H66002 Acute suppurative otitis media without spontaneous rupture of ear drum, left ear: Secondary | ICD-10-CM | POA: Diagnosis not present

## 2023-02-23 DIAGNOSIS — J09X2 Influenza due to identified novel influenza A virus with other respiratory manifestations: Secondary | ICD-10-CM | POA: Diagnosis not present

## 2023-04-03 DIAGNOSIS — R519 Headache, unspecified: Secondary | ICD-10-CM | POA: Diagnosis not present

## 2023-04-03 DIAGNOSIS — F411 Generalized anxiety disorder: Secondary | ICD-10-CM | POA: Diagnosis not present

## 2023-04-03 DIAGNOSIS — G932 Benign intracranial hypertension: Secondary | ICD-10-CM | POA: Diagnosis not present

## 2023-05-01 ENCOUNTER — Other Ambulatory Visit: Payer: Self-pay | Admitting: Internal Medicine

## 2023-05-01 DIAGNOSIS — Z1231 Encounter for screening mammogram for malignant neoplasm of breast: Secondary | ICD-10-CM
